# Patient Record
Sex: Female | Born: 1945 | Race: White | Hispanic: No | Smoking: Never smoker
Health system: Southern US, Community
[De-identification: ages and names within clinical notes are randomized; demographics above are authoritative.]

## PROBLEM LIST (undated history)

## (undated) DIAGNOSIS — K589 Irritable bowel syndrome without diarrhea: Secondary | ICD-10-CM

## (undated) DIAGNOSIS — I10 Essential (primary) hypertension: Secondary | ICD-10-CM

## (undated) DIAGNOSIS — K219 Gastro-esophageal reflux disease without esophagitis: Secondary | ICD-10-CM

## (undated) DIAGNOSIS — M353 Polymyalgia rheumatica: Secondary | ICD-10-CM

---

## 2014-05-05 ENCOUNTER — Other Ambulatory Visit: Payer: Self-pay | Admitting: Internal Medicine

## 2014-05-05 DIAGNOSIS — Z1231 Encounter for screening mammogram for malignant neoplasm of breast: Secondary | ICD-10-CM

## 2014-05-28 ENCOUNTER — Ambulatory Visit
Admission: RE | Admit: 2014-05-28 | Discharge: 2014-05-28 | Disposition: A | Discharge status: Home / Self Care | Payer: Medicare Other | Source: Ambulatory Visit | Attending: Internal Medicine | Admitting: Internal Medicine

## 2014-05-28 ENCOUNTER — Other Ambulatory Visit: Payer: Self-pay | Admitting: Internal Medicine

## 2014-05-28 DIAGNOSIS — Z1231 Encounter for screening mammogram for malignant neoplasm of breast: Secondary | ICD-10-CM

## 2015-12-22 ENCOUNTER — Other Ambulatory Visit (HOSPITAL_COMMUNITY): Payer: Self-pay | Admitting: Interventional Radiology

## 2015-12-22 DIAGNOSIS — IMO0002 Reserved for concepts with insufficient information to code with codable children: Secondary | ICD-10-CM

## 2015-12-28 ENCOUNTER — Ambulatory Visit (HOSPITAL_COMMUNITY)
Admission: RE | Admit: 2015-12-28 | Discharge: 2015-12-28 | Disposition: A | Discharge status: Home / Self Care | Payer: Medicare Other | Source: Ambulatory Visit | Attending: Interventional Radiology | Admitting: Interventional Radiology

## 2015-12-28 ENCOUNTER — Other Ambulatory Visit (HOSPITAL_COMMUNITY): Payer: Self-pay | Admitting: Interventional Radiology

## 2015-12-28 DIAGNOSIS — IMO0002 Reserved for concepts with insufficient information to code with codable children: Secondary | ICD-10-CM

## 2015-12-28 DIAGNOSIS — M545 Low back pain: Secondary | ICD-10-CM

## 2015-12-28 NOTE — Procedures (Signed)
S/P T 8 VP 

## 2015-12-29 ENCOUNTER — Other Ambulatory Visit: Payer: Self-pay | Admitting: Radiology

## 2015-12-30 ENCOUNTER — Encounter (HOSPITAL_COMMUNITY): Payer: Self-pay

## 2015-12-30 ENCOUNTER — Ambulatory Visit (HOSPITAL_COMMUNITY)
Admission: RE | Admit: 2015-12-30 | Discharge: 2015-12-30 | Disposition: A | Discharge status: Home / Self Care | Payer: Medicare Other | Source: Ambulatory Visit | Attending: Interventional Radiology | Admitting: Interventional Radiology

## 2015-12-30 DIAGNOSIS — I10 Essential (primary) hypertension: Secondary | ICD-10-CM | POA: Diagnosis not present

## 2015-12-30 DIAGNOSIS — Z91040 Latex allergy status: Secondary | ICD-10-CM | POA: Insufficient documentation

## 2015-12-30 DIAGNOSIS — Z882 Allergy status to sulfonamides status: Secondary | ICD-10-CM | POA: Insufficient documentation

## 2015-12-30 DIAGNOSIS — W19XXXA Unspecified fall, initial encounter: Secondary | ICD-10-CM | POA: Diagnosis not present

## 2015-12-30 DIAGNOSIS — K219 Gastro-esophageal reflux disease without esophagitis: Secondary | ICD-10-CM | POA: Diagnosis not present

## 2015-12-30 DIAGNOSIS — M353 Polymyalgia rheumatica: Secondary | ICD-10-CM | POA: Diagnosis not present

## 2015-12-30 DIAGNOSIS — S32039A Unspecified fracture of third lumbar vertebra, initial encounter for closed fracture: Secondary | ICD-10-CM | POA: Diagnosis not present

## 2015-12-30 DIAGNOSIS — M4856XA Collapsed vertebra, not elsewhere classified, lumbar region, initial encounter for fracture: Secondary | ICD-10-CM | POA: Diagnosis present

## 2015-12-30 DIAGNOSIS — M545 Low back pain: Secondary | ICD-10-CM

## 2015-12-30 DIAGNOSIS — K589 Irritable bowel syndrome without diarrhea: Secondary | ICD-10-CM | POA: Diagnosis not present

## 2015-12-30 DIAGNOSIS — Z880 Allergy status to penicillin: Secondary | ICD-10-CM | POA: Diagnosis not present

## 2015-12-30 DIAGNOSIS — IMO0002 Reserved for concepts with insufficient information to code with codable children: Secondary | ICD-10-CM

## 2015-12-30 HISTORY — DX: Polymyalgia rheumatica: M35.3

## 2015-12-30 HISTORY — DX: Irritable bowel syndrome, unspecified: K58.9

## 2015-12-30 HISTORY — DX: Essential (primary) hypertension: I10

## 2015-12-30 HISTORY — DX: Gastro-esophageal reflux disease without esophagitis: K21.9

## 2015-12-30 LAB — BASIC METABOLIC PANEL
ANION GAP: 12 (ref 5–15)
BUN: 14 mg/dL (ref 6–20)
CHLORIDE: 101 mmol/L (ref 101–111)
CO2: 26 mmol/L (ref 22–32)
Calcium: 9.6 mg/dL (ref 8.9–10.3)
Creatinine, Ser: 1.05 mg/dL — ABNORMAL HIGH (ref 0.44–1.00)
GFR calc non Af Amer: 52 mL/min — ABNORMAL LOW (ref >60)
GFR, EST AFRICAN AMERICAN: 60 mL/min — AB (ref >60)
Glucose, Bld: 103 mg/dL — ABNORMAL HIGH (ref 65–99)
Potassium: 3.5 mmol/L (ref 3.5–5.1)
Sodium: 139 mmol/L (ref 135–145)

## 2015-12-30 LAB — CBC
HEMATOCRIT: 38.5 % (ref 36.0–46.0)
HEMOGLOBIN: 12.9 g/dL (ref 12.0–15.0)
MCH: 28.7 pg (ref 26.0–34.0)
MCHC: 33.5 g/dL (ref 30.0–36.0)
MCV: 85.6 fL (ref 78.0–100.0)
Platelets: 263 10*3/uL (ref 150–400)
RBC: 4.5 MIL/uL (ref 3.87–5.11)
RDW: 14.2 % (ref 11.5–15.5)
WBC: 9.5 10*3/uL (ref 4.0–10.5)

## 2015-12-30 LAB — PROTIME-INR
INR: 1.04 (ref 0.00–1.49)
Prothrombin Time: 13.8 seconds (ref 11.6–15.2)

## 2015-12-30 LAB — APTT: aPTT: 43 seconds — ABNORMAL HIGH (ref 24–37)

## 2015-12-30 MED ORDER — SODIUM CHLORIDE 0.9 % IV SOLN
INTRAVENOUS | Status: AC | PRN
  Administered 2015-12-30: 50 mL/h via INTRAVENOUS

## 2015-12-30 MED ORDER — MIDAZOLAM HCL 2 MG/2ML IJ SOLN
INTRAMUSCULAR | Status: AC | PRN
  Administered 2015-12-30 (×2): 1 mg via INTRAVENOUS

## 2015-12-30 MED ORDER — IOPAMIDOL (ISOVUE-300) INJECTION 61%
INTRAVENOUS | Status: AC
  Administered 2015-12-30: 5 mL
  Filled 2015-12-30: qty 50

## 2015-12-30 MED ORDER — HYDROMORPHONE HCL 1 MG/ML IJ SOLN
INTRAMUSCULAR | Status: AC
  Filled 2015-12-30: qty 1

## 2015-12-30 MED ORDER — SODIUM CHLORIDE 0.9 % IV SOLN: INTRAVENOUS | Status: DC

## 2015-12-30 MED ORDER — CEFAZOLIN SODIUM-DEXTROSE 2-4 GM/100ML-% IV SOLN: 2.0000 g | INTRAVENOUS | Status: DC

## 2015-12-30 MED ORDER — TOBRAMYCIN SULFATE 1.2 G IJ SOLR
INTRAMUSCULAR | Status: AC
Start: 2015-12-30 — End: 2015-12-30
  Administered 2015-12-30: 1.2 g
  Filled 2015-12-30: qty 1.2

## 2015-12-30 MED ORDER — MIDAZOLAM HCL 2 MG/2ML IJ SOLN
INTRAMUSCULAR | Status: AC
  Filled 2015-12-30: qty 2

## 2015-12-30 MED ORDER — FENTANYL CITRATE (PF) 100 MCG/2ML IJ SOLN
INTRAMUSCULAR | Status: AC
  Filled 2015-12-30: qty 2

## 2015-12-30 MED ORDER — FENTANYL CITRATE (PF) 100 MCG/2ML IJ SOLN
INTRAMUSCULAR | Status: AC | PRN
  Administered 2015-12-30 (×3): 25 ug via INTRAVENOUS

## 2015-12-30 MED ORDER — HYDROMORPHONE HCL 1 MG/ML IJ SOLN
INTRAMUSCULAR | Status: AC | PRN
  Administered 2015-12-30 (×2): 1 mg via INTRAVENOUS

## 2015-12-30 MED ORDER — CLINDAMYCIN PHOSPHATE 600 MG/50ML IV SOLN
600.0000 mg | INTRAVENOUS | Status: AC
  Administered 2015-12-30: 600 mg via INTRAVENOUS
  Filled 2015-12-30: qty 50

## 2015-12-30 MED ORDER — SODIUM CHLORIDE 0.9 % IV SOLN
INTRAVENOUS | Status: DC
  Administered 2015-12-30: 08:00:00 via INTRAVENOUS

## 2015-12-30 MED ORDER — BUPIVACAINE HCL (PF) 0.25 % IJ SOLN
INTRAMUSCULAR | Status: AC
  Administered 2015-12-30: 15 mL
  Filled 2015-12-30: qty 30

## 2015-12-30 NOTE — Sedation Documentation (Signed)
Patient denies pain and is resting comfortably.  

## 2015-12-30 NOTE — Discharge Instructions (Signed)
1.No stooping,bending or lifting more than 10 lbs for 2 weeks. °2.Use  Walker to ambulate for 2 weeks. °3.RTC in 2 weeks.. °KYPHOPLASTY/VERTEBROPLASTY DISCHARGE INSTRUCTIONS ° °Medications: (check all that apply) ° °   Resume all home medications as before procedure. °   °              °  °Continue your pain medications as prescribed as needed.  Over the next 3-5 days, decrease your pain medication as tolerated.  Over the counter medications (i.e. Tylenol, ibuprofen, and aleve) may be substituted once severe/moderate pain symptoms have subsided. ° ° Wound Care: °- Bandages may be removed the day following your procedure.  You may get your incision wet once bandages are removed.  Bandaids may be used to cover the incisions until scab formation.  Topical ointments are optional. ° °- If you develop a fever greater than 101 degrees, have increased skin redness at the incision sites or pus-like oozing from incisions occurring within 1 week of the procedure, contact radiology at [phone] or [phone]. ° °- Ice pack to back for 15-20 minutes 2-3 time per day for first 2-3 days post procedure.  The ice will expedite muscle healing and help with the pain from the incisions. ° ° Activity: °- Bedrest today with limited activity for 24 hours post procedure. ° °- No driving for 48 hours. ° °- Increase your activity as tolerated after bedrest (with assistance if necessary). ° °- Refrain from any strenuous activity or heavy lifting (greater than 10 lbs.). ° ° Follow up: °- Contact radiology at [phone] or [phone] if any questions/concerns. ° °- A physician assistant from radiology will contact you in approximately 1 week. ° °- If a biopsy was performed at the time of your procedure, your referring physician should receive the results in usually 2-3 days. ° ° ° ° ° ° ° ° ° °

## 2015-12-30 NOTE — Procedures (Signed)
S/P L3 balloon kyphoplasty

## 2015-12-30 NOTE — H&P (Signed)
Chief Complaint: Patient was seen in consultation today for Lumbar 3 vertebroplasty/kyphoplasty at the request of Dr Sheran Luzichard Ramos  Referring Physician(s): Dr Sheran Luzichard Ramos  Supervising Physician: Julieanne Cottoneveshwar, Sanjeev  Patient Status: Outpatient  History of Present Illness: Monique Buchanan is a 70 y.o. female   Pt recently lost husband 3 mo ago Admits not hydrating and eating well Became faint and fell backwards in an elevator 4 weeks ago Evaluation with Dr Ethelene Halamos Imaging reveals Lumbar 3 acute fracture Referred to Dr Corliss Skainseveshwar for evaluation/treatment Now scheduled for L3 VP/KP   Past Medical History  Diagnosis Date  . Hypertension   . GERD (gastroesophageal reflux disease)   . Polymyalgia rheumatica (HCC)   . Irritable bowel syndrome     History reviewed. No pertinent past surgical history.  Allergies: Calcium; Erythromycin; Penicillins; Sulfa antibiotics; Warfarin; Levofloxacin; Calcium carbonate; Calcium citrate; Gluten meal; Nsaids; Other; Tramadol; Vancomycin; Latex; and Tape  Medications: Prior to Admission medications   Not on File     History reviewed. No pertinent family history.  Social History   Social History  . Marital Status: Unknown    Spouse Name: N/A  . Number of Children: N/A  . Years of Education: N/A   Social History Main Topics  . Smoking status: Never Smoker   . Smokeless tobacco: None  . Alcohol Use: None  . Drug Use: None  . Sexual Activity: Not Asked   Other Topics Concern  . None   Social History Narrative  . None      Review of Systems: A 12 point ROS discussed and pertinent positives are indicated in the HPI above.  All other systems are negative.  Review of Systems  Constitutional: Positive for activity change, appetite change and fatigue. Negative for fever.  Respiratory: Negative for cough and shortness of breath.   Cardiovascular: Negative for chest pain.  Musculoskeletal: Positive for back pain.    Psychiatric/Behavioral: Negative for behavioral problems and confusion.    Vital Signs: BP 142/63 mmHg  Pulse 85  Temp(Src) 97.9 F (36.6 C) (Oral)  Resp 18  Ht 5' 5.25" (1.657 m)  Wt 145 lb (65.772 kg)  BMI 23.95 kg/m2  SpO2 100%  Physical Exam  Constitutional: She is oriented to person, place, and time.  Cardiovascular: Normal rate and regular rhythm.   Pulmonary/Chest: Effort normal and breath sounds normal. She has no wheezes.  Abdominal: Soft. Bowel sounds are normal. There is no tenderness.  Musculoskeletal: Normal range of motion.  Low back pain  Neurological: She is alert and oriented to person, place, and time.  Skin: Skin is warm and dry.  Psychiatric: She has a normal mood and affect. Her behavior is normal. Judgment and thought content normal.  Nursing note and vitals reviewed.   Mallampati Score:  MD Evaluation Airway: WNL Heart: WNL Abdomen: WNL Chest/ Lungs: WNL ASA  Classification: 2 Mallampati/Airway Score: Two  Imaging: No results found.  Labs:  CBC:  Recent Labs  12/30/15 0800  WBC 9.5  HGB 12.9  HCT 38.5  PLT 263    COAGS:  Recent Labs  12/30/15 0800  INR 1.04  APTT 43*    BMP:  Recent Labs  12/30/15 0800  NA 139  K 3.5  CL 101  CO2 26  GLUCOSE 103*  BUN 14  CALCIUM 9.6  CREATININE 1.05*  GFRNONAA 52*  GFRAA 60*    LIVER FUNCTION TESTS: No results for input(s): BILITOT, AST, ALT, ALKPHOS, PROT, ALBUMIN in the last 8760 hours.  TUMOR MARKERS: No results for input(s): AFPTM, CEA, CA199, CHROMGRNA in the last 8760 hours.  Assessment and Plan:  Back injury Acute painful Lumbar 3 fracture Scheduled for VP/KP Risks and Benefits discussed with the patient including, but not limited to education regarding the natural healing process of compression fractures without intervention, bleeding, infection, cement migration which may cause spinal cord damage, paralysis, pulmonary embolism or even death. All of the  patient's questions were answered, patient is agreeable to proceed. Consent signed and in chart.    Thank you for this interesting consult.  I greatly enjoyed meeting Monique Buchanan and look forward to participating in their care.  A copy of this report was sent to the requesting provider on this date.  Electronically Signed: Leaman Abe A 12/30/2015, 9:08 AM   I spent a total of  30 Minutes   in face to face in clinical consultation, greater than 50% of which was counseling/coordinating care for L3 VP/KP

## 2016-01-03 ENCOUNTER — Other Ambulatory Visit (HOSPITAL_COMMUNITY): Payer: Self-pay | Admitting: Interventional Radiology

## 2016-01-03 DIAGNOSIS — IMO0002 Reserved for concepts with insufficient information to code with codable children: Secondary | ICD-10-CM

## 2016-01-13 ENCOUNTER — Ambulatory Visit (HOSPITAL_COMMUNITY): Admission: RE | Admit: 2016-01-13 | Payer: Medicare Other | Source: Ambulatory Visit

## 2016-01-17 ENCOUNTER — Ambulatory Visit (HOSPITAL_COMMUNITY)
Admission: RE | Admit: 2016-01-17 | Discharge: 2016-01-17 | Disposition: A | Discharge status: Home / Self Care | Payer: Medicare Other | Source: Ambulatory Visit | Attending: Interventional Radiology | Admitting: Interventional Radiology

## 2016-01-17 DIAGNOSIS — IMO0002 Reserved for concepts with insufficient information to code with codable children: Secondary | ICD-10-CM

## 2016-01-17 HISTORY — PX: IR GENERIC HISTORICAL: IMG1180011

## 2016-01-20 ENCOUNTER — Encounter (HOSPITAL_COMMUNITY): Payer: Self-pay | Admitting: Interventional Radiology

## 2016-11-08 IMAGING — XA IR KYPHO VERTEBRAL LUMBAR AUGMENTATION
1 series · 13 of 16 positions shown · IV contrast (IODINE)
Comparison: MRI of the lumbar sacral spine from outside
institution.

INDICATION: Severe low back pain secondary to compression fracture at L3.

EXAM:
IR KYPHO VERTEBRAL LUMBAR AUGMENTATION

[Series 300: spine · 13 of 16 slices shown]
[im 1/16]
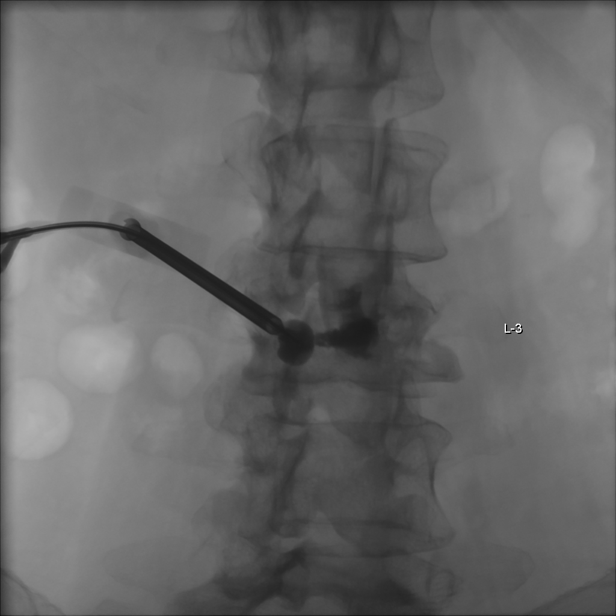
[im 2/16]
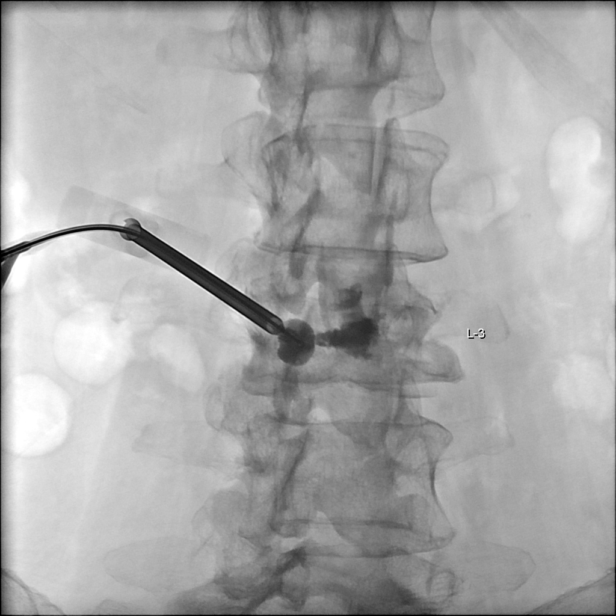
[im 4/16]
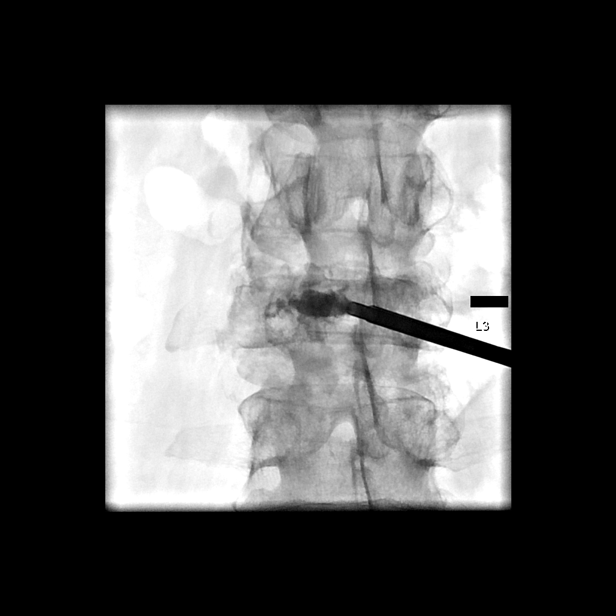
[im 5/16]
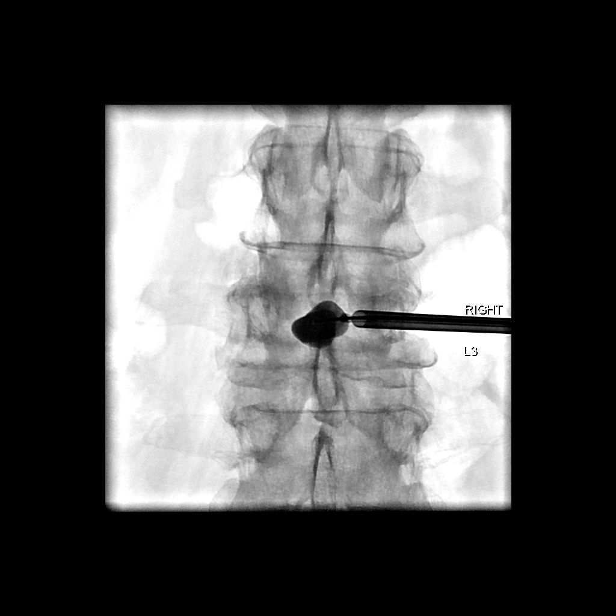
[im 6/16]
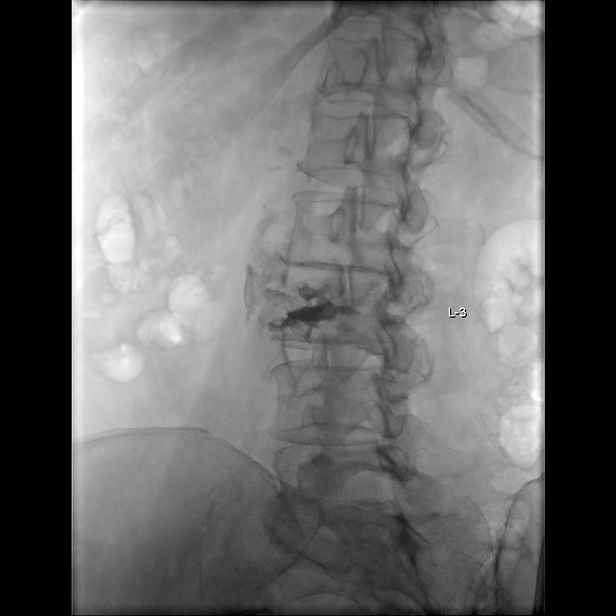
[im 7/16]
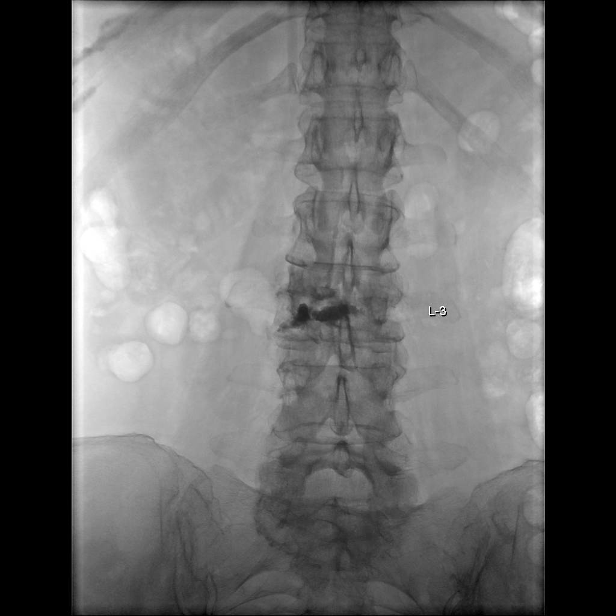
[im 9/16]
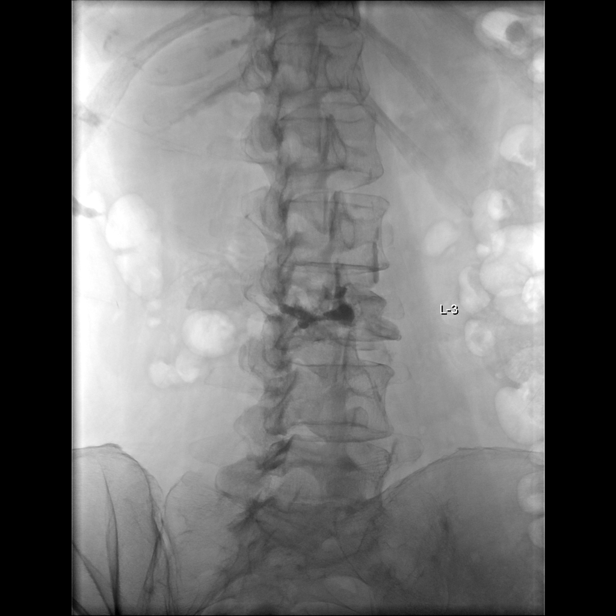
[im 10/16]
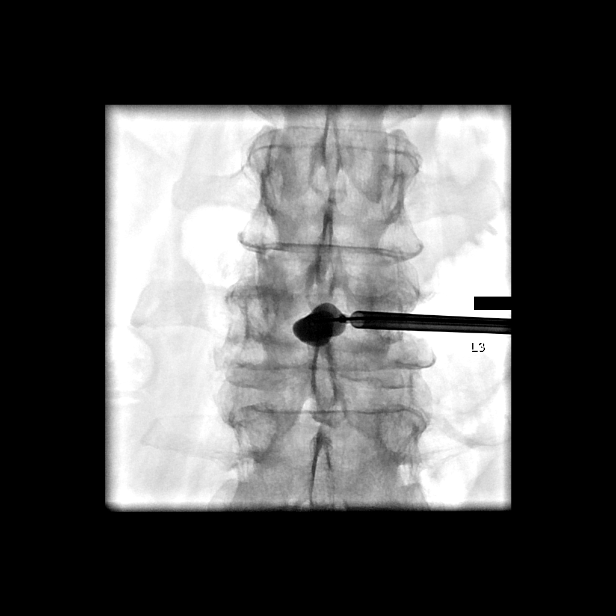
[im 11/16]
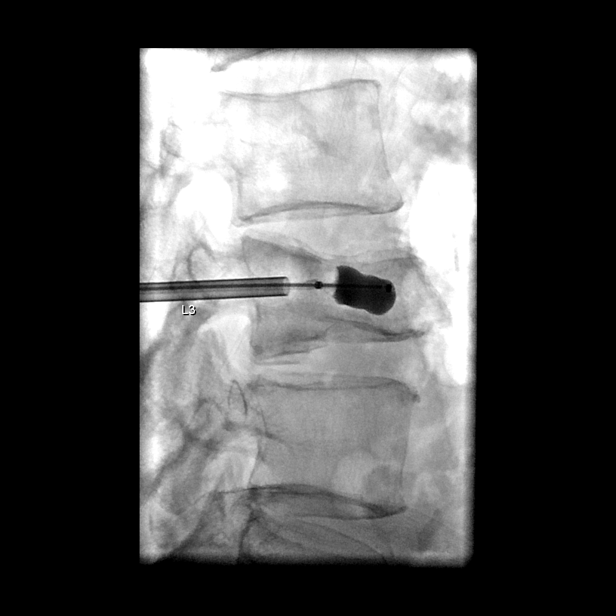
[im 12/16]
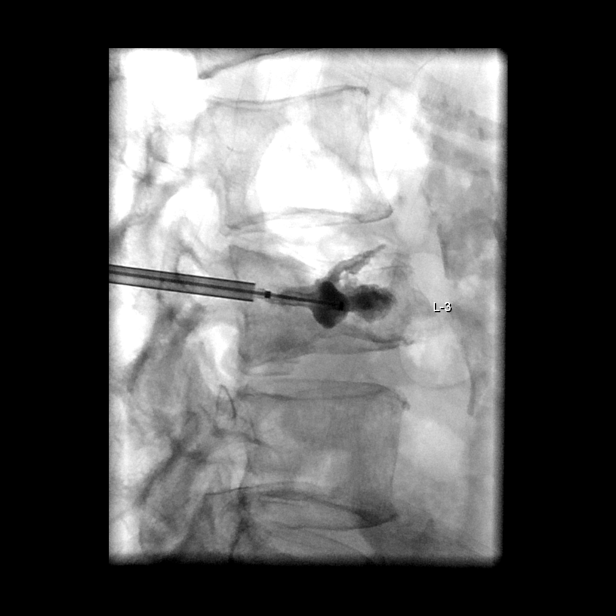
[im 13/16]
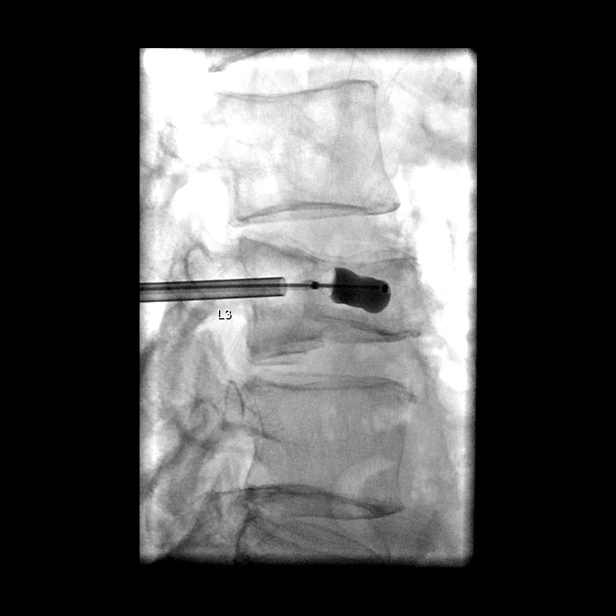
[im 15/16]
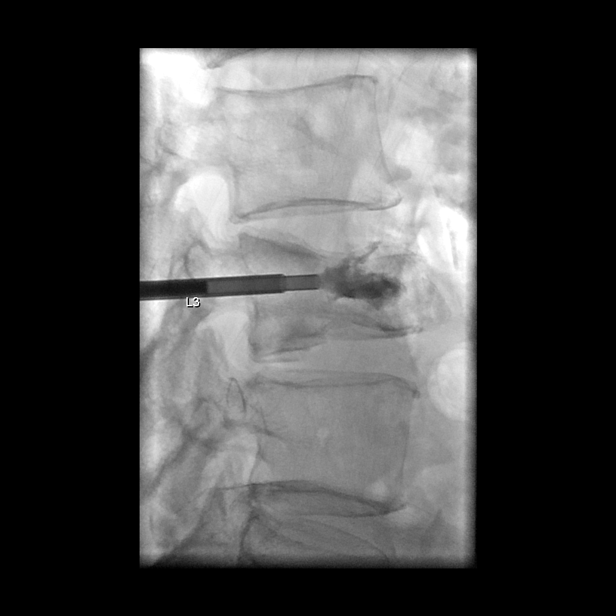
[im 16/16]
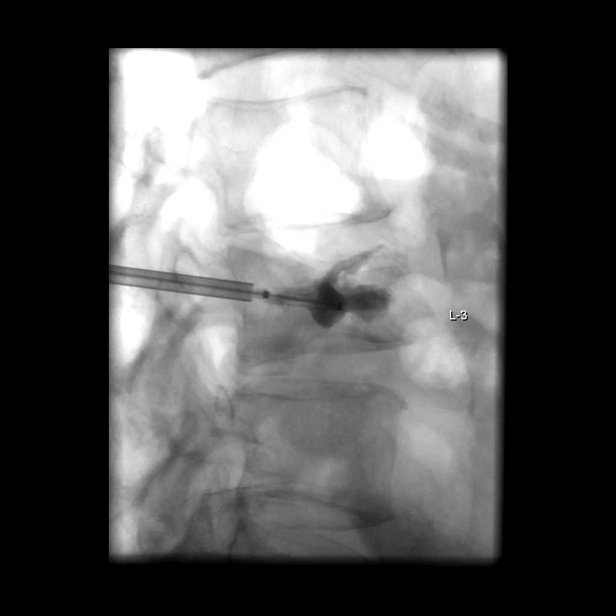

[13 of 16 positions shown; findings below may reference images not displayed]

MEDICATIONS:
As antibiotic prophylaxis, 600 clindamycin IV was ordered
pre-procedure and administered intravenously within 1 hour of
incision.

ANESTHESIA/SEDATION:
Moderate (conscious) sedation was employed during this procedure. A
total of Versed 2 mg and Fentanyl 100 mcg was administered
intravenously. Dilaudid 2 mg IV.

Moderate Sedation Time: 35 minutes. The patient's level of
consciousness and vital signs were monitored continuously by
radiology nursing throughout the procedure under my direct
supervision.

FLUOROSCOPY TIME:  Fluoroscopy Time: 13 minutes 0 seconds (8578 mGy)

COMPLICATIONS:
None immediate.

PROCEDURE:
Following a full explanation of the procedure along with the
potential associated complications, an informed witnessed consent
was obtained.

The patient was placed prone on the fluoroscopic table. The skin
overlying the lumbar region was then prepped and draped in the usual
sterile fashion. Both pedicles at L3 were then infiltrated with
0.25% bupivacaine followed by the advancement of an 11-gauge
Jamshidi needle through both pedicles into the posterior one-third
at L3. These were then exchanged for a Kyphon advanced osteo
introducer system comprised of a working cannula and a Kyphon osteo
drill.

The combinations were then advanced over a Kyphon osteo bone pin
until the tip of the Kyphon osteo drill was in the posterior third
at L3 bilaterally.

At this time, the bone pins were removed. In a medial trajectory,
the combinations were advanced until the tips of the working cannula
were inside the posterior one-third at L3.

The osteo drills were removed.

Through the working cannula, a Kyphon inflatable bone tamp 20 x 3
was advanced and positioned with the distal marker 5 mm from the
anterior aspect of L3. Crossing of the midline was seen on the AP
projection. At this time, the balloons were expanded using contrast
via a Kyphon inflation syringe device via microtubing.

Inflations were continued until there was apposition with the
superior and the inferior endplates.

At this time, methylmethacrylate mixture was reconstituted with
Tobramycin in the Kyphon bone mixing device system. This was then
loaded onto the Kyphon bone fillers.

The balloons were deflated and removed followed by the instillation
of 3 bone filler equivalents of methylmethacrylate mixture at L3
with excellent filling in the AP and lateral projections. No
extravasation was noted in the disk spaces or posteriorly into the
spinal canal. No epidural venous contamination was seen.

The working cannula and the bone filler were then retrieved and
removed. Hemostasis was achieved at the skin entry sites.
IMPRESSION: 1. Status post vertebral body augmentation using balloon kyphoplasty
at L3 as described without event.

## 2022-03-10 ENCOUNTER — Emergency Department (HOSPITAL_BASED_OUTPATIENT_CLINIC_OR_DEPARTMENT_OTHER): Payer: Medicare Other

## 2022-03-10 ENCOUNTER — Other Ambulatory Visit: Payer: Self-pay

## 2022-03-10 ENCOUNTER — Encounter (HOSPITAL_BASED_OUTPATIENT_CLINIC_OR_DEPARTMENT_OTHER): Payer: Self-pay | Admitting: Emergency Medicine

## 2022-03-10 ENCOUNTER — Emergency Department (HOSPITAL_BASED_OUTPATIENT_CLINIC_OR_DEPARTMENT_OTHER)
Admission: EM | Admit: 2022-03-10 | Discharge: 2022-03-10 | Disposition: A | Discharge status: Home / Self Care | Payer: Medicare Other | Attending: Emergency Medicine | Admitting: Emergency Medicine

## 2022-03-10 DIAGNOSIS — S6991XA Unspecified injury of right wrist, hand and finger(s), initial encounter: Secondary | ICD-10-CM | POA: Diagnosis present

## 2022-03-10 DIAGNOSIS — S22080A Wedge compression fracture of T11-T12 vertebra, initial encounter for closed fracture: Secondary | ICD-10-CM

## 2022-03-10 DIAGNOSIS — S63696A Other sprain of right little finger, initial encounter: Secondary | ICD-10-CM | POA: Diagnosis not present

## 2022-03-10 DIAGNOSIS — E860 Dehydration: Secondary | ICD-10-CM | POA: Insufficient documentation

## 2022-03-10 DIAGNOSIS — R42 Dizziness and giddiness: Secondary | ICD-10-CM

## 2022-03-10 DIAGNOSIS — S22080D Wedge compression fracture of T11-T12 vertebra, subsequent encounter for fracture with routine healing: Secondary | ICD-10-CM | POA: Insufficient documentation

## 2022-03-10 DIAGNOSIS — Z91040 Latex allergy status: Secondary | ICD-10-CM | POA: Diagnosis not present

## 2022-03-10 DIAGNOSIS — I951 Orthostatic hypotension: Secondary | ICD-10-CM | POA: Insufficient documentation

## 2022-03-10 DIAGNOSIS — W1839XA Other fall on same level, initial encounter: Secondary | ICD-10-CM | POA: Diagnosis not present

## 2022-03-10 DIAGNOSIS — I10 Essential (primary) hypertension: Secondary | ICD-10-CM | POA: Diagnosis not present

## 2022-03-10 DIAGNOSIS — Z79899 Other long term (current) drug therapy: Secondary | ICD-10-CM | POA: Insufficient documentation

## 2022-03-10 DIAGNOSIS — W19XXXD Unspecified fall, subsequent encounter: Secondary | ICD-10-CM

## 2022-03-10 LAB — COMPREHENSIVE METABOLIC PANEL
ALT: 15 U/L (ref 0–44)
AST: 15 U/L (ref 15–41)
Albumin: 4.3 g/dL (ref 3.5–5.0)
Alkaline Phosphatase: 84 U/L (ref 38–126)
Anion gap: 8 (ref 5–15)
BUN: 23 mg/dL (ref 8–23)
CO2: 28 mmol/L (ref 22–32)
Calcium: 8.9 mg/dL (ref 8.9–10.3)
Chloride: 103 mmol/L (ref 98–111)
Creatinine, Ser: 1.2 mg/dL — ABNORMAL HIGH (ref 0.44–1.00)
GFR, Estimated: 46 mL/min — ABNORMAL LOW (ref >60)
Glucose, Bld: 102 mg/dL — ABNORMAL HIGH (ref 70–99)
Potassium: 3.4 mmol/L — ABNORMAL LOW (ref 3.5–5.1)
Sodium: 139 mmol/L (ref 135–145)
Total Bilirubin: 0.7 mg/dL (ref 0.3–1.2)
Total Protein: 6.7 g/dL (ref 6.5–8.1)

## 2022-03-10 LAB — CBC WITH DIFFERENTIAL/PLATELET
Abs Immature Granulocytes: 0.07 10*3/uL (ref 0.00–0.07)
Basophils Absolute: 0.1 10*3/uL (ref 0.0–0.1)
Basophils Relative: 0 %
Eosinophils Absolute: 0.1 10*3/uL (ref 0.0–0.5)
Eosinophils Relative: 0 %
HCT: 41.7 % (ref 36.0–46.0)
Hemoglobin: 14 g/dL (ref 12.0–15.0)
Immature Granulocytes: 1 %
Lymphocytes Relative: 19 %
Lymphs Abs: 2.2 10*3/uL (ref 0.7–4.0)
MCH: 29.4 pg (ref 26.0–34.0)
MCHC: 33.6 g/dL (ref 30.0–36.0)
MCV: 87.4 fL (ref 80.0–100.0)
Monocytes Absolute: 1.1 10*3/uL — ABNORMAL HIGH (ref 0.1–1.0)
Monocytes Relative: 10 %
Neutro Abs: 8 10*3/uL — ABNORMAL HIGH (ref 1.7–7.7)
Neutrophils Relative %: 70 %
Platelets: 276 10*3/uL (ref 150–400)
RBC: 4.77 MIL/uL (ref 3.87–5.11)
RDW: 13.8 % (ref 11.5–15.5)
WBC: 11.5 10*3/uL — ABNORMAL HIGH (ref 4.0–10.5)
nRBC: 0 % (ref 0.0–0.2)

## 2022-03-10 LAB — URINALYSIS, ROUTINE W REFLEX MICROSCOPIC
Bilirubin Urine: NEGATIVE
Glucose, UA: NEGATIVE mg/dL
Hgb urine dipstick: NEGATIVE
Ketones, ur: NEGATIVE mg/dL
Leukocytes,Ua: NEGATIVE
Nitrite: NEGATIVE
Protein, ur: NEGATIVE mg/dL
Specific Gravity, Urine: 1.015 (ref 1.005–1.030)
pH: 6.5 (ref 5.0–8.0)

## 2022-03-10 LAB — MAGNESIUM: Magnesium: 2.1 mg/dL (ref 1.7–2.4)

## 2022-03-10 MED ORDER — POTASSIUM CHLORIDE CRYS ER 20 MEQ PO TBCR
20.0000 meq | EXTENDED_RELEASE_TABLET | Freq: Once | ORAL | Status: AC
  Administered 2022-03-10: 20 meq via ORAL
  Filled 2022-03-10: qty 1

## 2022-03-10 MED ORDER — LACTATED RINGERS IV BOLUS
1000.0000 mL | Freq: Once | INTRAVENOUS | Status: AC
  Administered 2022-03-10: 1000 mL via INTRAVENOUS

## 2022-03-10 NOTE — ED Provider Notes (Signed)
[facility] EMERGENCY DEPARTMENT Provider Note   CSN: [id number] Arrival date & time: 03/10/22  0915     History  Chief Complaint  Patient presents with   Fall   Finger Injury    Monique Buchanan is a 76 y.o. female.  With PMH of HTN, GERD, polymyalgia rheumatica who presents with multiple falls and dizziness over the past week without LOC.  Patient said her symptoms started about a week ago when she woke up and felt lightheaded and dizzy upon awakening worse with positional changes.  She does not feel like it changes with head movement and does not feel like the world is spinning around her.  However these episodes have been associated with imbalance for the past week and a few falls with resulting bruises on her legs and pain in her right pinky finger from previous fall this week.  She is still ambulating despite the falls and feels like her symptoms have been slowly improving.  She does note though feeling very off balance this past week and crawling on the floor to get around when she typically walks with a cane.  She has had no head injury or loss of consciousness.  Denies any fevers, nausea, vomiting, diarrhea, melena, hematochezia, urinary symptoms, chest pain or shortness of breath.  Denies any dysphagia, dysarthria, visual changes, tinnitus, hearing changes, focal weakness numbness or tingling.  She thought she may have had a stroke.  She went to see her PCP earlier this week who did test her urine and diagnosed her with a UTI.  She is currently on antibiotics for that but she is unsure which one.   Fall       Home Medications Prior to Admission medications   Medication Sig Start Date End Date Taking? Authorizing Provider  acetaminophen-codeine (TYLENOL #3) 300-30 MG tablet Take 0.5 tablets by mouth 2 (two) times daily as needed for moderate pain.    [provider]  ACIDOPHILUS LACTOBACILLUS PO Take 1 capsule by mouth daily.    [provider]   Biotin 2500 MCG CAPS Take 2 tablets by mouth daily.    [provider]  calcium carbonate (TUMS EX) 750 MG chewable tablet Chew 1 tablet by mouth 3 (three) times daily as needed for heartburn.    [provider]  cetirizine (ZYRTEC) 10 MG tablet Take 10 mg by mouth daily.    [provider]  Cholecalciferol (VITAMIN D3) 2000 units TABS Take 1 tablet by mouth daily.    [provider]  clobetasol ointment (TEMOVATE) 0.05 % Apply 1 application topically 2 (two) times daily as needed.    [provider]  cyclobenzaprine (FLEXERIL) 10 MG tablet Take 10 mg by mouth at bedtime as needed for muscle spasms.    [provider]  diazepam (VALIUM) 5 MG tablet Take 5 mg by mouth at bedtime.     [provider]  diclofenac sodium (VOLTAREN) 1 % GEL Apply 2 g topically 4 (four) times daily as needed.    [provider]  diphenhydrAMINE (BENADRYL) 25 MG tablet Take 25 mg by mouth at bedtime as needed.    [provider]  docusate sodium (COLACE) 100 MG capsule Take 100 mg by mouth 2 (two) times daily.     [provider]  hydrochlorothiazide (HYDRODIURIL) 25 MG tablet Take 12.5-25 mg by mouth See admin instructions. Take 25 mg and 12.5 mg alternating dosage    [provider]  HYDROcodone-acetaminophen (NORCO/VICODIN) 5-325 MG tablet  Take 1 tablet by mouth every 6 (six) hours as needed for moderate pain.    [provider]  lactase (LACTAID) 3000 units tablet Take 3,000 Units by mouth 3 (three) times daily as needed.    [provider]  Magnesium Oxide (MAG-200 PO) Take 1 tablet by mouth daily.     [provider]  Melatonin 3 MG TBDP Take 2 tablets by mouth at bedtime.     [provider]  omeprazole (PRILOSEC) 20 MG capsule Take 20 mg by mouth 2 (two) times daily before a meal.    [provider]  oxyCODONE-acetaminophen (PERCOCET/ROXICET) 5-325 MG tablet Take 1 tablet  by mouth every 6 (six) hours as needed for severe pain.    [provider]  potassium chloride SA (K-DUR,KLOR-CON) 20 MEQ tablet Take 20 mEq by mouth daily.    [provider]  ranitidine (ZANTAC) 150 MG tablet Take 150 mg by mouth 2 (two) times daily.    [provider]  simvastatin (ZOCOR) 10 MG tablet Take 5 mg by mouth daily.     [provider]  zolpidem (AMBIEN) 5 MG tablet Take 5 mg by mouth at bedtime.     [provider]      Allergies    Calcium, Erythromycin, Penicillins, Sulfa antibiotics, Warfarin, Levofloxacin, Calcium carbonate, Calcium citrate, Gluten meal, Nsaids, Other, Tramadol, Vancomycin, Latex, and Tape    Review of Systems   Review of Systems  Physical Exam Updated Vital Signs BP (!) 144/86   Pulse 66   Temp 97.9 F (36.6 C) (Oral)   Resp 18   Ht 5\' 5"  (1.651 m)   Wt 65.8 kg   SpO2 98%   BMI 24.13 kg/m  Physical Exam Constitutional: Alert and oriented. Well appearing and in no distress. Eyes: Conjunctivae are normal. ENT      Head: Normocephalic and atraumatic.      Nose: No congestion.      Mouth/Throat: Mucous membranes are moist.      Neck/Back: No stridor.No midline ttp, stepoff or deformity to C/T/L spine Cardiovascular: S1, S2,  Normal and symmetric distal pulses are present in all extremities.Warm and well perfused. Respiratory: Normal respiratory effort. Breath sounds are normal. Gastrointestinal: Soft and nontender. There is no CVA tenderness. Musculoskeletal: Normal range of motion in all extremities. Scattered bruises of b/l LE but no bony ttp and pain with ROM. Right DIP 5th digit pinky ttp and swelling with flexion, but flexion/extension intact throughout right 5th digit, cap refill <2 seconds. Full grip strength biilaterally 5/5 Neurologic: Normal speech and language without aphasia. AAOx4. PERRL. EOMI without nystagmus. Face symmetrical without droop. CN II-XII intact. Normal facial sensation.  Tongue midline. No pronator drift. 5/5 strength in upper and lower extremities. Normal sensation to light touch in all extremities. Normal finger-to-nose. Normal gait with cane. No focal neurologic deficits are appreciated. Skin: Skin is warm, Psychiatric: Mood and affect are normal. Speech and behavior are normal.  ED Results / Procedures / Treatments   Labs (all labs ordered are listed, but only abnormal results are displayed) Labs Reviewed  COMPREHENSIVE METABOLIC PANEL - Abnormal; Notable for the following components:      Result Value   Potassium 3.4 (*)    Glucose, Bld 102 (*)    Creatinine, Ser 1.20 (*)    GFR, Estimated 46 (*)    All other components within normal limits  CBC WITH DIFFERENTIAL/PLATELET - Abnormal; Notable for the following components:   WBC 11.5 (*)  Neutro Abs 8.0 (*)    Monocytes Absolute 1.1 (*)    All other components within normal limits  URINALYSIS, ROUTINE W REFLEX MICROSCOPIC  MAGNESIUM    EKG EKG Interpretation  Date/Time:  Saturday March 10 2022 11:24:33 EDT Ventricular Rate:  54 PR Interval:  149 QRS Duration: 90 QT Interval:  440 QTC Calculation: 417 R Axis:   25 Text Interpretation: Sinus rhythm Abnormal R-wave progression, early transition Nonspecific T abnormalities, anterior leads Nonspecific T wave inversions in anterior leads V1 through V4 with no reciprocal changes and no previous EKGs to compare to Confirmed by Georgina Snell [phone]) on 03/10/2022 11:34:04 AM  Radiology MR BRAIN WO CONTRAST  Result Date: 03/10/2022 CLINICAL DATA:  Ataxia and dizziness for 1 week. Fall 1 week ago. EXAM: MRI HEAD WITHOUT CONTRAST TECHNIQUE: Multiplanar, multiecho pulse sequences of the brain and surrounding structures were obtained without intravenous contrast. COMPARISON:  CT head without contrast 08/01/2020 FINDINGS: Brain: Mild generalized atrophy and periventricular white matter changes are again noted. No acute infarct, hemorrhage, or mass  lesion is present. Dilated perivascular spaces are present within the basal ganglia. The ventricles are of normal size. No significant extraaxial fluid collection is present. The internal auditory canals are within normal limits. Insert normal brainstem Vascular: Flow is present in the major intracranial arteries. Skull and upper cervical spine: The craniocervical junction is normal. Upper cervical spine is within normal limits. Marrow signal is unremarkable. Sinuses/Orbits: The paranasal sinuses and mastoid air cells are clear. Bilateral lens replacements are noted. Globes and orbits are otherwise unremarkable. IMPRESSION: 1. No acute intracranial abnormality or significant interval change. 2. Stable mild generalized atrophy and periventricular white matter disease. This likely reflects the sequela of chronic microvascular ischemia. Electronically Signed   By: San Morelle M.D.   On: 03/10/2022 13:48   DG Finger Little Right  Result Date: 03/10/2022 CLINICAL DATA:  Right small finger pain after fall EXAM: RIGHT LITTLE FINGER 2+V COMPARISON:  X-ray 05/27/2020 FINDINGS: No evidence of acute fracture. No dislocation. Moderate-severe osteoarthritic changes of the small finger distal interphalangeal joint with joint space loss and bulky marginal osteophyte formation. There is joint space narrowing of the PIP joint. No focal soft tissue swelling. IMPRESSION: 1. No evidence of acute fracture or dislocation. 2. 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 Electronically Signed   By: Davina Poke D.O.   On: 03/10/2022 11:30   DG Chest 2 View  Result Date: 03/10/2022 CLINICAL DATA:  Multiple falls last week. EXAM: CHEST - 2 VIEW COMPARISON:  Two-view chest x-ray 10/04/2020 FINDINGS: Heart size is normal. Lungs are clear. A superior endplate compression fracture at T11 is new since the prior exam. 30% loss of height is noted anteriorly. No other acute fractures are evident. IMPRESSION: 1.  Superior endplate compression fracture at T11 is new since the prior exam and may be acute. 2. No acute cardiopulmonary disease. Electronically Signed   By: San Morelle M.D.   On: 03/10/2022 11:29    Procedures Procedures    Medications Ordered in ED Medications  lactated ringers bolus 1,000 mL (0 mLs Intravenous Stopped 03/10/22 1420)  potassium chloride SA (KLOR-CON M) CR tablet 20 mEq (20 mEq Oral Given 03/10/22 1216)  lactated ringers bolus 1,000 mL ( Intravenous Stopped 03/10/22 1512)    ED Course/ Medical Decision Making/ A&P Clinical Course as of 03/10/22 1846  Sat Mar 10, 2022  1556 Patient was reassessed, she is no longer orthostatic.  She is able to ambulate  with her cane with steady gait.  She feels safe to go home and would prefer going home.  I advised slow transitions from sitting to standing and using her walker at home.  She will finish her course of antibiotics at home for UTI.  Strict return precaution discussed.  She is in agreement with this plan and safe for discharge home. [VB]    Clinical Course User Index [VB] Elgie Congo, MD                           Medical Decision Making Kimery Cantalupo is a 76 y.o. female.  With PMH of HTN, GERD, polymyalgia rheumatica who presents with multiple falls and dizziness over the past week without LOC.  Currently on antibiotics for UTI.  Patient presented with lightheadedness and dizziness that was more positional in nature.  Her orthostatics were positive here consistent with her symptoms.  On exam, she did not have any focal neurologic deficits but due to her age and underlying risk factors an MRI of the brain was obtained which showed no evidence of ICH or stroke.  Her labs suggested mild AKI creatinine 1.2 with mild hypokalemia 3.4 consistent with suspected dehydration and consistent with orthostatic nature.  She was given IV fluids and oral potassium with improvement in orthostats and ambulating without difficulty  with her cane.  She had no other concerning electrolyte abnormalities.  Her repeat UA obtained here showed no evidence of infection and she is currently on antibiotics for known 10,000-50,000 CFU/mL Escherichia coli (A) on 03/05/22 which was found in her care everywhere results.  She had a mild leukocytosis 11.5 consistent with known UTI.  However, she is not hypotensive not tachycardic and afebrile, no concern for sepsis at this time.  A chest x-ray was obtained to further evaluate for any evidence of pneumonia or infection.  There was no pneumonia there was no pulmonary edema there is no pneumothorax but did show evidence of a T11 endplate fracture.  I palpated the entire spine of the patient and she had no point tenderness.  She did states she has had previous spine fractures.  Additionally x-ray of the pinky was obtained with no evidence of acute fracture.  She was buddy taped.  Since she is neurologically intact and does not have any active pain, less likely to be acute fracture and advise close follow-up with her orthopedic doctor or the orthopedic doctor we provided in her discharge paperwork.  Since patient was feeling better with fluids and oral potassium and asymptomatic she was discharged home with close follow-up with her PCP and strict return precautions.  She was in agreement with this plan and safe for discharge.  Amount and/or Complexity of Data Reviewed Labs: ordered. Radiology: ordered.  Risk Prescription drug management.      Final Clinical Impression(s) / ED Diagnoses Final diagnoses:  Fall, subsequent encounter  Dizziness and giddiness  Other sprain of right little finger, initial encounter  Compression fracture of T11 vertebra, subsequent encounter ([facility])  Orthostatic hypotension  Dehydration    Rx / DC Orders ED Discharge Orders     None         Elgie Congo, MD 03/10/22 [phone]

## 2022-03-10 NOTE — ED Triage Notes (Addendum)
Patient reports multiple falls last week, due to dizziness and presents today with pain to right pinky finger onset last night. Patient seen by PCP yesterday and is scheduled for CT on Tuesday.

## 2022-03-10 NOTE — ED Notes (Signed)
Patient off the floor for MRI.

## 2022-03-10 NOTE — Discharge Instructions (Addendum)
You were seen in the emergency department today after lightheadedness and recent falls.  Thankfully, your MRI showed no evidence of stroke.  Your labs did suggest you were dehydrated and you were orthostatic so we gave you IV fluids with improvement.  The x-ray of your finger showed no fracture.  The x-ray of your chest showed an old fracture of your T11 vertebrae.  Make sure to follow-up with your home orthopedist regarding these findings or call our orthopedist listed in your discharge paperwork.  Follow-up with your home doctor regarding your visit to the ER today.  Make sure you do drink plenty of fluids and do slow transitions from laying down to sitting and standing before walking around.  Continue to use your walker at home.  Come back to the ER for any further falls, mental status change, uncontrolled nausea or vomiting, inability keep down any fluids, or other symptoms concerning to you.

## 2022-03-10 NOTE — ED Notes (Signed)
Patient transported to MRI 

## 2022-03-10 NOTE — ED Notes (Signed)
ED Provider at bedside. 

## 2022-03-10 NOTE — ED Notes (Signed)
Patient is laying flat for 10 min for ortho static vitals

## 2022-03-10 NOTE — ED Notes (Signed)
Pt in radiology
